# Patient Record
Sex: Female | Born: 1935 | Race: White | Hispanic: No | Marital: Single
Health system: Southern US, Community
[De-identification: ages and names within clinical notes are randomized; demographics above are authoritative.]

---

## 2004-11-18 ENCOUNTER — Ambulatory Visit: Payer: Self-pay | Admitting: Oncology

## 2004-11-28 ENCOUNTER — Ambulatory Visit: Payer: Self-pay | Admitting: Oncology

## 2004-12-11 ENCOUNTER — Ambulatory Visit: Payer: Self-pay | Admitting: Family Medicine

## 2005-05-19 ENCOUNTER — Ambulatory Visit: Payer: Self-pay | Admitting: Oncology

## 2005-05-29 ENCOUNTER — Ambulatory Visit: Payer: Self-pay | Admitting: Oncology

## 2005-11-17 ENCOUNTER — Ambulatory Visit: Payer: Self-pay | Admitting: Oncology

## 2005-11-28 ENCOUNTER — Ambulatory Visit: Payer: Self-pay | Admitting: Oncology

## 2006-01-08 ENCOUNTER — Ambulatory Visit: Payer: Self-pay | Admitting: Family Medicine

## 2006-11-17 ENCOUNTER — Ambulatory Visit: Payer: Self-pay | Admitting: Oncology

## 2006-11-28 ENCOUNTER — Ambulatory Visit: Payer: Self-pay | Admitting: Oncology

## 2007-02-18 ENCOUNTER — Ambulatory Visit: Payer: Self-pay | Admitting: Family Medicine

## 2007-02-23 ENCOUNTER — Ambulatory Visit: Payer: Self-pay | Admitting: Family Medicine

## 2007-04-23 ENCOUNTER — Ambulatory Visit: Payer: Self-pay | Admitting: Unknown Physician Specialty

## 2007-10-30 ENCOUNTER — Ambulatory Visit: Payer: Self-pay | Admitting: Internal Medicine

## 2007-11-17 ENCOUNTER — Ambulatory Visit: Payer: Self-pay | Admitting: Oncology

## 2007-11-29 ENCOUNTER — Ambulatory Visit: Payer: Self-pay | Admitting: Oncology

## 2007-11-29 ENCOUNTER — Ambulatory Visit: Payer: Self-pay | Admitting: Internal Medicine

## 2008-03-21 ENCOUNTER — Ambulatory Visit: Payer: Self-pay | Admitting: Family Medicine

## 2008-03-23 ENCOUNTER — Ambulatory Visit: Payer: Self-pay | Admitting: Family Medicine

## 2008-05-17 ENCOUNTER — Ambulatory Visit: Payer: Self-pay | Admitting: Ophthalmology

## 2008-05-17 ENCOUNTER — Other Ambulatory Visit: Payer: Self-pay

## 2008-05-30 ENCOUNTER — Ambulatory Visit: Payer: Self-pay | Admitting: Ophthalmology

## 2008-10-29 ENCOUNTER — Ambulatory Visit: Payer: Self-pay | Admitting: Internal Medicine

## 2008-11-27 ENCOUNTER — Ambulatory Visit: Payer: Self-pay | Admitting: Internal Medicine

## 2008-11-28 ENCOUNTER — Ambulatory Visit: Payer: Self-pay | Admitting: Internal Medicine

## 2009-04-05 ENCOUNTER — Ambulatory Visit: Payer: Self-pay | Admitting: Family Medicine

## 2009-10-29 ENCOUNTER — Ambulatory Visit: Payer: Self-pay | Admitting: Internal Medicine

## 2009-11-26 ENCOUNTER — Ambulatory Visit: Payer: Self-pay | Admitting: Internal Medicine

## 2009-11-28 ENCOUNTER — Ambulatory Visit: Payer: Self-pay | Admitting: Internal Medicine

## 2010-12-09 ENCOUNTER — Ambulatory Visit: Payer: Self-pay | Admitting: Internal Medicine

## 2010-12-29 ENCOUNTER — Ambulatory Visit: Payer: Self-pay | Admitting: Internal Medicine

## 2012-01-12 ENCOUNTER — Ambulatory Visit: Payer: Self-pay | Admitting: Internal Medicine

## 2012-01-12 LAB — CBC CANCER CENTER
Basophil #: 0.1 x10 3/mm (ref 0.0–0.1)
Basophil %: 1 %
Eosinophil #: 0.3 x10 3/mm (ref 0.0–0.7)
Eosinophil %: 5.5 %
HCT: 35.7 % (ref 35.0–47.0)
HGB: 11.9 g/dL — ABNORMAL LOW (ref 12.0–16.0)
Lymphocyte #: 1.3 x10 3/mm (ref 1.0–3.6)
Lymphocyte %: 24.7 %
MCH: 32.8 pg (ref 26.0–34.0)
MCHC: 33.3 g/dL (ref 32.0–36.0)
MCV: 98.6 fL (ref 80–100)
Monocyte #: 0.3 x10 3/mm (ref 0.0–0.7)
Monocyte %: 6.1 %
Neutrophil #: 3.3 x10 3/mm (ref 1.4–6.5)
Neutrophil %: 62.7 %
Platelet: 188 x10 3/mm (ref 150–440)
RBC: 3.62 10*6/uL — ABNORMAL LOW (ref 3.80–5.20)
RDW: 13.1 % (ref 11.5–14.5)
WBC: 5.3 x10 3/mm (ref 3.6–11.0)

## 2012-01-12 LAB — COMPREHENSIVE METABOLIC PANEL
Albumin: 4 g/dL (ref 3.4–5.0)
Alkaline Phosphatase: 83 U/L (ref 50–136)
Anion Gap: 6 — ABNORMAL LOW (ref 7–16)
BUN: 28 mg/dL — ABNORMAL HIGH (ref 7–18)
Bilirubin,Total: 0.6 mg/dL (ref 0.2–1.0)
Calcium, Total: 9.4 mg/dL (ref 8.5–10.1)
Chloride: 103 mmol/L (ref 98–107)
Co2: 31 mmol/L (ref 21–32)
Creatinine: 1.53 mg/dL — ABNORMAL HIGH (ref 0.60–1.30)
EGFR (African American): 41 — ABNORMAL LOW
EGFR (Non-African Amer.): 34 — ABNORMAL LOW
Glucose: 95 mg/dL (ref 65–99)
Osmolality: 285 (ref 275–301)
Potassium: 3.9 mmol/L (ref 3.5–5.1)
SGOT(AST): 26 U/L (ref 15–37)
SGPT (ALT): 21 U/L
Sodium: 140 mmol/L (ref 136–145)
Total Protein: 7.4 g/dL (ref 6.4–8.2)

## 2012-01-21 ENCOUNTER — Inpatient Hospital Stay: Payer: Self-pay | Admitting: Unknown Physician Specialty

## 2012-01-21 LAB — CBC
HCT: 33.5 % — ABNORMAL LOW (ref 35.0–47.0)
MCH: 32.6 pg (ref 26.0–34.0)
MCHC: 33.1 g/dL (ref 32.0–36.0)
MCV: 98 fL (ref 80–100)
RBC: 3.41 10*6/uL — ABNORMAL LOW (ref 3.80–5.20)
RDW: 13.9 % (ref 11.5–14.5)
WBC: 6.1 10*3/uL (ref 3.6–11.0)

## 2012-01-21 LAB — BASIC METABOLIC PANEL
Calcium, Total: 9.2 mg/dL (ref 8.5–10.1)
Chloride: 103 mmol/L (ref 98–107)
Co2: 29 mmol/L (ref 21–32)
Creatinine: 1.38 mg/dL — ABNORMAL HIGH (ref 0.60–1.30)
EGFR (African American): 47 — ABNORMAL LOW
Osmolality: 294 (ref 275–301)
Potassium: 4 mmol/L (ref 3.5–5.1)
Sodium: 143 mmol/L (ref 136–145)

## 2012-01-22 LAB — HEMOGLOBIN: HGB: 9.2 g/dL — ABNORMAL LOW (ref 12.0–16.0)

## 2012-01-23 LAB — BASIC METABOLIC PANEL
BUN: 24 mg/dL — ABNORMAL HIGH (ref 7–18)
Calcium, Total: 8 mg/dL — ABNORMAL LOW (ref 8.5–10.1)
Chloride: 105 mmol/L (ref 98–107)
Osmolality: 290 (ref 275–301)
Potassium: 3.7 mmol/L (ref 3.5–5.1)
Sodium: 143 mmol/L (ref 136–145)

## 2012-01-23 LAB — HEMOGLOBIN
HGB: 8.3 g/dL — ABNORMAL LOW (ref 12.0–16.0)
HGB: 7.9 g/dL — ABNORMAL LOW (ref 12.0–16.0)

## 2012-01-24 LAB — BASIC METABOLIC PANEL
Anion Gap: 11 (ref 7–16)
BUN: 20 mg/dL — ABNORMAL HIGH (ref 7–18)
EGFR (Non-African Amer.): 44 — ABNORMAL LOW
Glucose: 103 mg/dL — ABNORMAL HIGH (ref 65–99)
Osmolality: 286 (ref 275–301)
Potassium: 3.4 mmol/L — ABNORMAL LOW (ref 3.5–5.1)

## 2012-01-24 LAB — HEMOGLOBIN: HGB: 9.8 g/dL — ABNORMAL LOW (ref 12.0–16.0)

## 2012-01-27 ENCOUNTER — Encounter: Payer: Self-pay | Admitting: Internal Medicine

## 2012-01-30 ENCOUNTER — Ambulatory Visit: Payer: Self-pay | Admitting: Internal Medicine

## 2012-01-30 ENCOUNTER — Encounter: Payer: Self-pay | Admitting: Internal Medicine

## 2012-02-27 ENCOUNTER — Encounter: Payer: Self-pay | Admitting: Internal Medicine

## 2012-03-30 ENCOUNTER — Inpatient Hospital Stay: Payer: Self-pay | Admitting: Specialist

## 2012-03-30 LAB — URINALYSIS, COMPLETE
Bilirubin,UR: NEGATIVE
Glucose,UR: NEGATIVE mg/dL (ref 0–75)
Ketone: NEGATIVE
Nitrite: NEGATIVE

## 2012-03-30 LAB — COMPREHENSIVE METABOLIC PANEL
Albumin: 2.6 g/dL — ABNORMAL LOW (ref 3.4–5.0)
Alkaline Phosphatase: 112 U/L (ref 50–136)
BUN: 27 mg/dL — ABNORMAL HIGH (ref 7–18)
Chloride: 103 mmol/L (ref 98–107)
Creatinine: 1.6 mg/dL — ABNORMAL HIGH (ref 0.60–1.30)
Glucose: 131 mg/dL — ABNORMAL HIGH (ref 65–99)
SGPT (ALT): 14 U/L
Total Protein: 7.3 g/dL (ref 6.4–8.2)

## 2012-03-30 LAB — CBC
HCT: 30.7 % — ABNORMAL LOW (ref 35.0–47.0)
RDW: 15.6 % — ABNORMAL HIGH (ref 11.5–14.5)
WBC: 8.1 10*3/uL (ref 3.6–11.0)

## 2012-03-30 LAB — TROPONIN I: Troponin-I: 0.02 ng/mL

## 2012-03-31 LAB — CBC WITH DIFFERENTIAL/PLATELET
Basophil #: 0.1 10*3/uL (ref 0.0–0.1)
Eosinophil #: 0.1 10*3/uL (ref 0.0–0.7)
Eosinophil %: 1 %
HCT: 27.7 % — ABNORMAL LOW (ref 35.0–47.0)
HGB: 9 g/dL — ABNORMAL LOW (ref 12.0–16.0)
Lymphocyte #: 0.7 10*3/uL — ABNORMAL LOW (ref 1.0–3.6)
Lymphocyte %: 11.2 %
MCHC: 32.6 g/dL (ref 32.0–36.0)
MCV: 93 fL (ref 80–100)
Neutrophil #: 5.3 10*3/uL (ref 1.4–6.5)
RBC: 2.97 10*6/uL — ABNORMAL LOW (ref 3.80–5.20)
RDW: 15.9 % — ABNORMAL HIGH (ref 11.5–14.5)

## 2012-03-31 LAB — BASIC METABOLIC PANEL
Anion Gap: 13 (ref 7–16)
BUN: 22 mg/dL — ABNORMAL HIGH (ref 7–18)
Calcium, Total: 8.3 mg/dL — ABNORMAL LOW (ref 8.5–10.1)
Chloride: 108 mmol/L — ABNORMAL HIGH (ref 98–107)
Co2: 21 mmol/L (ref 21–32)
Creatinine: 1.28 mg/dL (ref 0.60–1.30)
EGFR (African American): 51 — ABNORMAL LOW
Osmolality: 285 (ref 275–301)
Potassium: 4.1 mmol/L (ref 3.5–5.1)
Sodium: 142 mmol/L (ref 136–145)

## 2012-04-02 ENCOUNTER — Encounter: Payer: Self-pay | Admitting: Internal Medicine

## 2012-04-09 ENCOUNTER — Ambulatory Visit: Payer: Self-pay | Admitting: Internal Medicine

## 2012-04-09 LAB — URINALYSIS, COMPLETE
Bacteria: NEGATIVE
Bilirubin,UR: NEGATIVE
Blood: NEGATIVE
Ketone: NEGATIVE
RBC,UR: NONE SEEN /HPF (ref 0–5)

## 2012-04-11 LAB — URINE CULTURE

## 2012-04-28 ENCOUNTER — Encounter: Payer: Self-pay | Admitting: Internal Medicine

## 2013-06-10 ENCOUNTER — Ambulatory Visit: Payer: Self-pay | Admitting: Emergency Medicine

## 2013-06-10 LAB — BASIC METABOLIC PANEL
Anion Gap: 10 (ref 7–16)
Calcium, Total: 9.5 mg/dL (ref 8.5–10.1)
Chloride: 103 mmol/L (ref 98–107)
Co2: 26 mmol/L (ref 21–32)
Creatinine: 1.52 mg/dL — ABNORMAL HIGH (ref 0.60–1.30)
EGFR (African American): 35 — ABNORMAL LOW
EGFR (Non-African Amer.): 30 — ABNORMAL LOW
Osmolality: 284 (ref 275–301)
Sodium: 139 mmol/L (ref 136–145)

## 2013-06-10 LAB — CBC
HCT: 30.7 % — ABNORMAL LOW (ref 35.0–47.0)
MCHC: 33.4 g/dL (ref 32.0–36.0)
MCV: 95 fL (ref 80–100)
Platelet: 159 10*3/uL (ref 150–440)
RBC: 3.23 10*6/uL — ABNORMAL LOW (ref 3.80–5.20)

## 2013-06-12 ENCOUNTER — Inpatient Hospital Stay: Payer: Self-pay | Admitting: Internal Medicine

## 2013-06-12 LAB — CBC WITH DIFFERENTIAL/PLATELET
Basophil #: 0 10*3/uL (ref 0.0–0.1)
Eosinophil #: 0.1 10*3/uL (ref 0.0–0.7)
HCT: 26.9 % — ABNORMAL LOW (ref 35.0–47.0)
HGB: 9.3 g/dL — ABNORMAL LOW (ref 12.0–16.0)
Lymphocyte #: 0.8 10*3/uL — ABNORMAL LOW (ref 1.0–3.6)
Lymphocyte %: 15.1 %
MCHC: 34.5 g/dL (ref 32.0–36.0)
MCV: 93 fL (ref 80–100)
Monocyte #: 0.3 x10 3/mm (ref 0.2–0.9)
Monocyte %: 6.6 %
Neutrophil %: 74.9 %
Platelet: 143 10*3/uL — ABNORMAL LOW (ref 150–440)
RBC: 2.88 10*6/uL — ABNORMAL LOW (ref 3.80–5.20)
RDW: 12.7 % (ref 11.5–14.5)
WBC: 5.1 10*3/uL (ref 3.6–11.0)

## 2013-06-12 LAB — BASIC METABOLIC PANEL
Anion Gap: 7 (ref 7–16)
Chloride: 105 mmol/L (ref 98–107)
EGFR (African American): 42 — ABNORMAL LOW
Glucose: 82 mg/dL (ref 65–99)
Osmolality: 282 (ref 275–301)
Sodium: 139 mmol/L (ref 136–145)

## 2013-06-12 LAB — MAGNESIUM: Magnesium: 1.4 mg/dL — ABNORMAL LOW

## 2013-06-13 LAB — BASIC METABOLIC PANEL
Anion Gap: 7 (ref 7–16)
BUN: 38 mg/dL — ABNORMAL HIGH (ref 7–18)
Calcium, Total: 8.7 mg/dL (ref 8.5–10.1)
Chloride: 100 mmol/L (ref 98–107)
Creatinine: 1.43 mg/dL — ABNORMAL HIGH (ref 0.60–1.30)
Potassium: 3.9 mmol/L (ref 3.5–5.1)
Sodium: 135 mmol/L — ABNORMAL LOW (ref 136–145)

## 2013-06-13 LAB — MAGNESIUM: Magnesium: 2.2 mg/dL

## 2013-06-14 LAB — STOOL CULTURE

## 2013-07-07 IMAGING — CT CT HEAD WITHOUT CONTRAST
2 series · 15 of 30 positions shown, 19 images · non-contrast
Comparison: none

REASON FOR EXAM: CVA
COMMENTS:   May transport without cardiac monitor

PROCEDURE:     CT  - CT HEAD WITHOUT CONTRAST  - March 30, 2012  [DATE]
RESULT:     Head CT dated 03/30/2012.
TECHNIQUE: Helical noncontrasted 5 mm sections were obtained from the skull
base to the vertex.

[Series 2: without · axial · non-contrast · 0.41mm/px · z∈[+322,+466]mm · 13 of 35 slices shown, 17 images]
[im 3/35  brain]
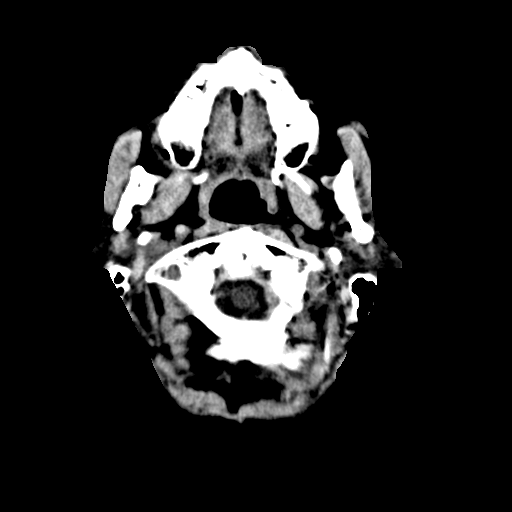
[im 3/35  bone]
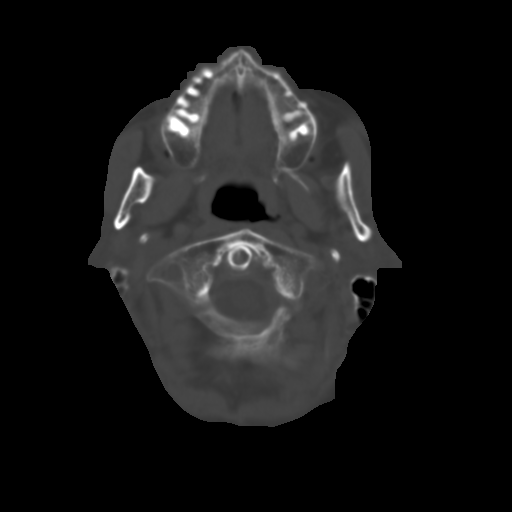
[im 5/35  brain]
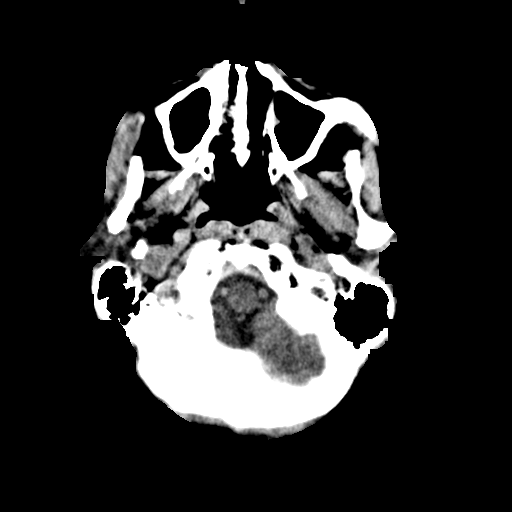
[im 8/35  brain]
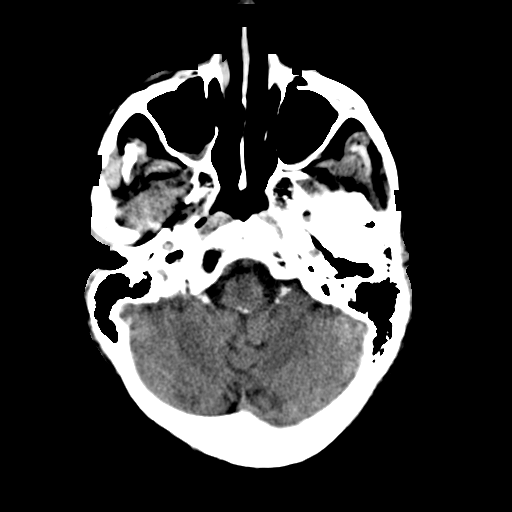
[im 10/35  brain]
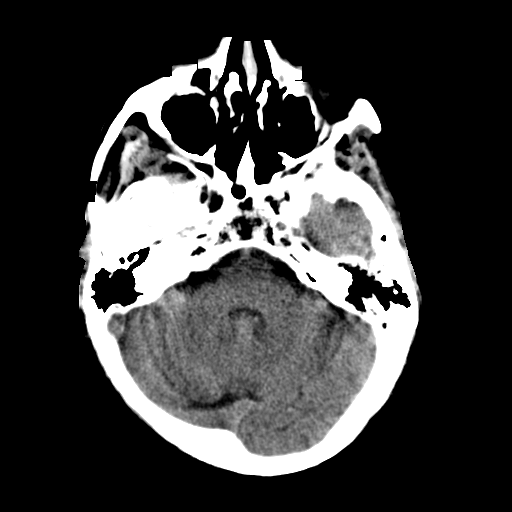
[im 13/35  brain]
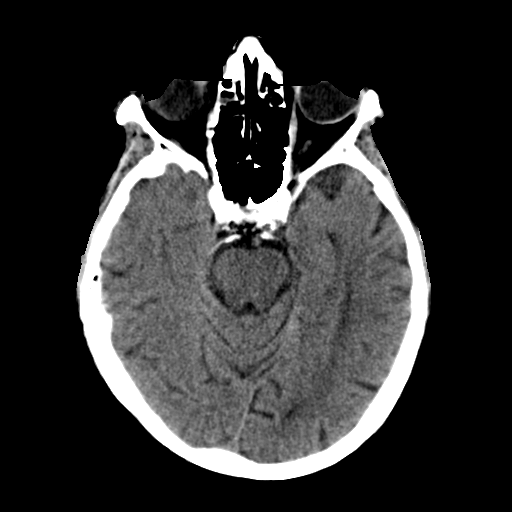
[im 13/35  bone]
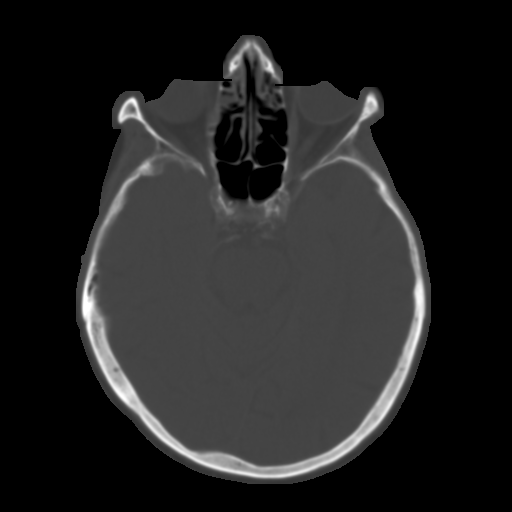
[im 15/35  brain]
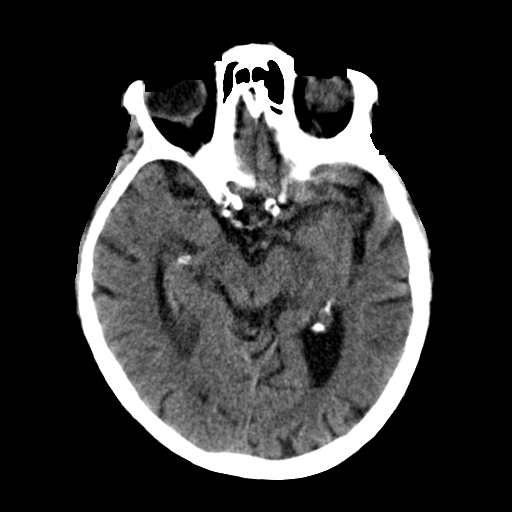
[im 18/35  brain]
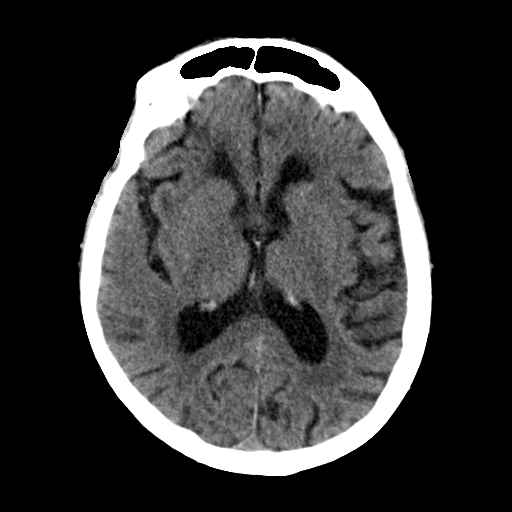
[im 20/35  brain]
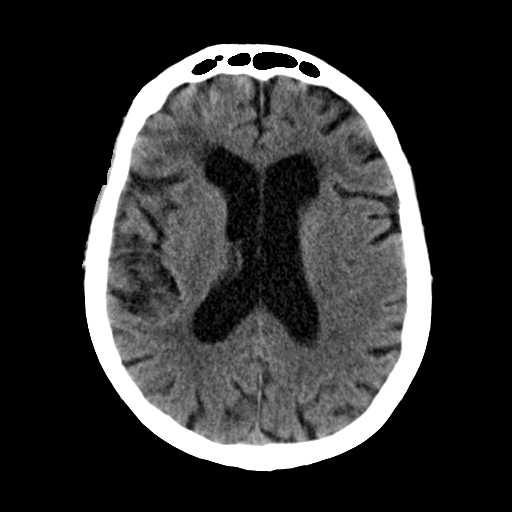
[im 22/35  brain]
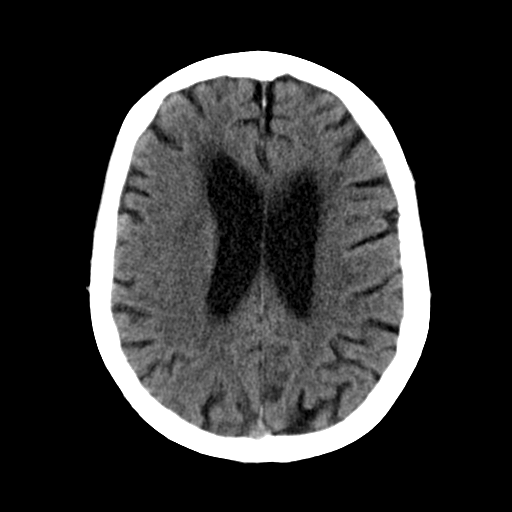
[im 22/35  bone]
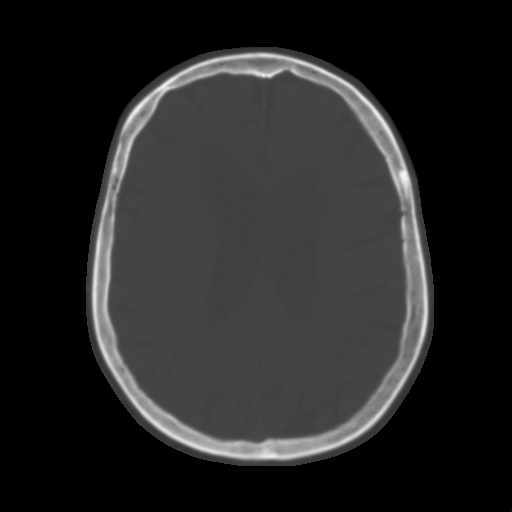
[im 25/35  brain]
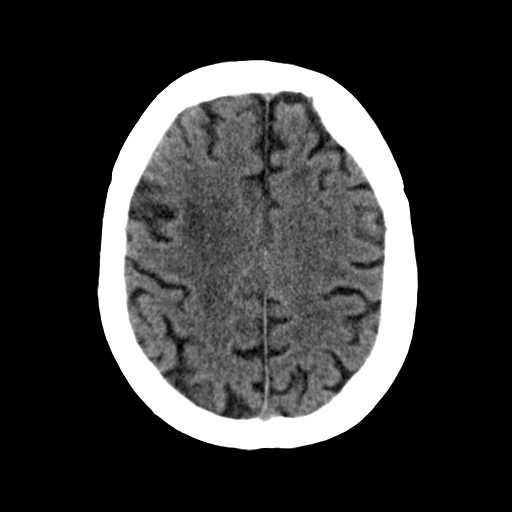
[im 27/35  brain]
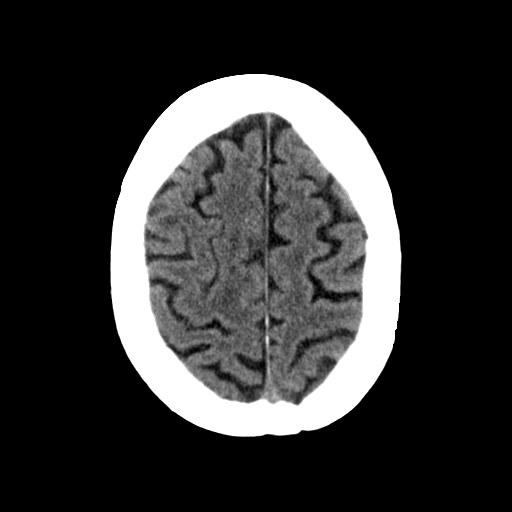
[im 30/35  brain]
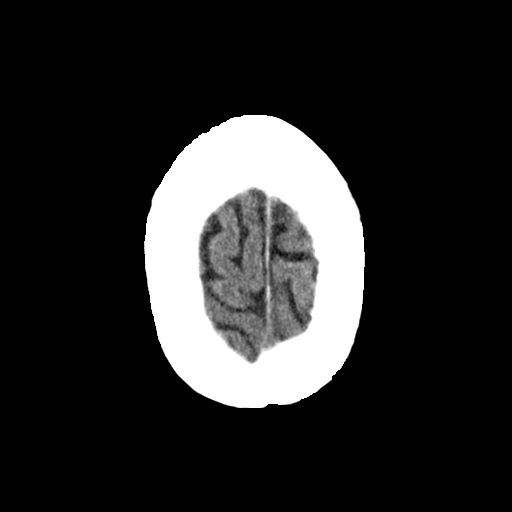
[im 32/35  brain]
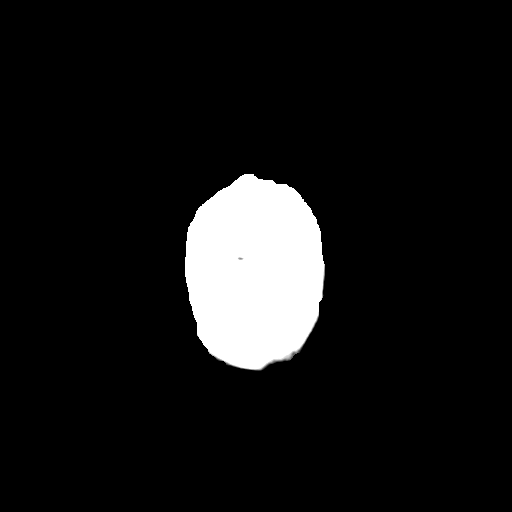
[im 32/35  bone]
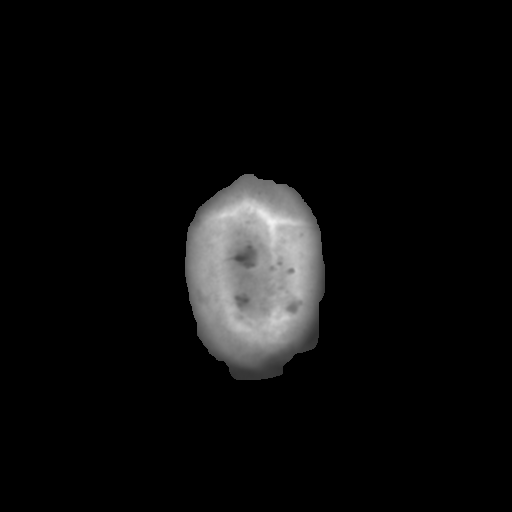

[Series 3: bone · axial · 0.41mm/px · z∈[+322,+346]mm · 2 of 35 slices shown]
[im 3/35  bone]
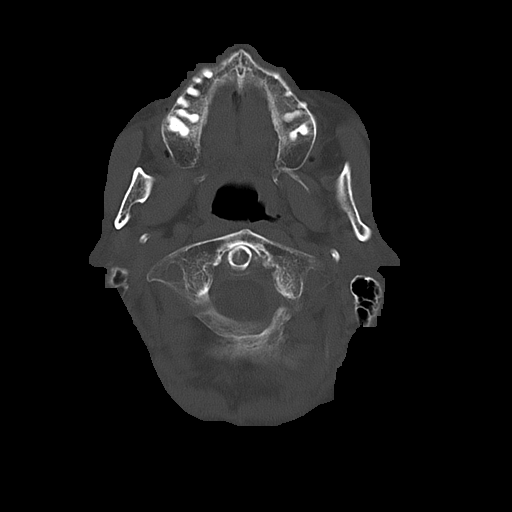
[im 8/35  bone]
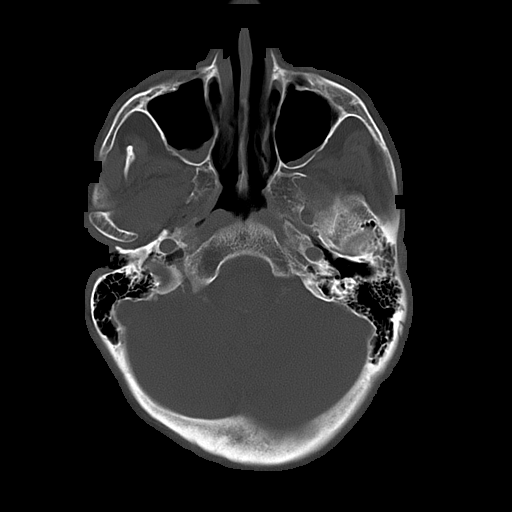

[15 of 30 positions shown; findings below may reference images not displayed]

FINDINGS: There is no evidence of intra-axial nor extra-axial fluid
collections nor evidence of acute hemorrhage. There is no evidence of mass
effect. Mild to moderate diffuse cortical atrophy is appreciated. Diffuse
areas of low attenuation project within the subcortical, deep, and
periventricular white matter regions. Hydrocephalus ex vacuo is appreciated.
There is no evidence of a depressed skull fracture. A mild air-fluid levels
identified within the right maxillary sinus. Remaining visualized paranasal
sinuses and mastoid air cells are patent.
IMPRESSION: Findings which may reps an sequela sinus disease involving
the right maxillary sinus.
2. Moderate involutional changes
3. No evidence of acute abnormalities.

## 2013-12-29 DEATH — deceased

## 2015-04-20 NOTE — Discharge Summary (Signed)
PATIENT NAMKarene [name]:  [name], [name] MR#:  [id number] DATE OF BIRTH:  [birth year]  DATE OF ADMISSION:  06/12/2013 DATE OF DISCHARGE:  06/15/2013   PRIMARY CARE PHYSICIAN: [name] MaplesGlenn R. Beckey DowningWillett, MD  DISCHARGE DIAGNOSES:  1. Acute T4 compression deformity with retropulsed fragment.  2. Acute renal failure plus chronic kidney disease.  3. Hypomagnesemia.  4. Hypertension.  5. Chronic atrial fibrillation.  6. Hypothyroidism.   CONDITION: Stable.   CODE STATUS: Full code.   HOME MEDICATIONS:  1. Amlodipine 5 mg p.o. daily. 2. Synthroid 112 mcg p.o. daily. 3. Pantoprazole 40 mg p.o. daily.   DIET: Low sodium diet.   ACTIVITY: As tolerated.   FOLLOWUP CARE: Follow up with PCP within 1 to 2 weeks, and also, the patient needs continued TLSO, [facility], Dr. Venetia MaxonStern within 1 week.   REASON FOR ADMISSION: Fall.   HOSPITAL COURSE: The patient is an 81 year old pleasant Caucasian female with multiple medical problems. The patient lost her balance after slamming her car door and landed on her side. She was sent to ED. Pelvic and chest x-ray revealed a fracture of the superior and inferior pubic rami on the right side and compression fracture of the lumbar spine. ED physician discussed with orthopedic doctor, who recommended pain management. The patient's CAT scan of the lumbar spine showed acute compression deformity of T2 with 6.5 mm retropulsed fragment, and ED physician discussed with the [facility], Dr. Venetia MaxonStern, who recommended outpatient followup with him after the patient gets discharged. In addition, he recommended a TLSO brace. For detailed history and physical examination, please refer to the admission note dictated by Dr. Amado CoeGouru. After admission, the patient got physical therapy. The patient had a low magnesium which was treated with magnesium. Hypomagnesemia has improved. The patient also had mild dehydration and acute renal failure and was treated with  IV fluid support. Kidney function has been stable. The patient has no symptoms, but according to physical therapy evaluation, the patient needs subacute rehab placement. The patient is clinically stable and will be discharged to a subacute rehab today. I discussed the patient's discharge plan with the patient and the case manager.   TIME SPENT: About 36 minutes.   ____________________________ [name] PollackQing Velma Agnes, MD qc:OSi D: 06/15/2013 11:13:57 ET T: 06/15/2013 11:33:09 ET JOB#: [id number]  cc: [name] PollackQing Lilybeth Vien, MD, <Dictator> [name] PollackQING Leida Luton MD ELECTRONICALLY SIGNED 06/15/2013 16:22

## 2015-04-20 NOTE — H&P (Signed)
[name], [name] MR#:  [id number] DATE OF BIRTH:  1922/11/06  DATE OF ADMISSION:  06/11/2013  PRIMARY CARE PHYSICIAN:  Barry Brunner, M.D.   REFERRING PHYSICIAN:  Dr. Glenetta Hew.  CHIEF COMPLAINT:  Fall.   HISTORY OF PRESENT ILLNESS:  [name] is an 81 year old pleasant Caucasian female with multiple medical problems who sustained a fall at her home.  [name] lost balance after slamming her car door and landed on her side.  She managed to walk around some at home with a walker before coming to the ER as the pain is getting worse.  A pelvic x-ray has revealed a fracture from the superior and inferior pubic rami on the right and compression fractures on the lumbar spine.  The ER physician has discussed with orthopedics doctor on-call who has recommended only pain management.  Eventually CAT scan of the lumbar spine has revealed acute compression deformity of T12 with a 6.5 mm retropulsed fragment.  Dr. Glenetta Hew has discussed this with neurosurgeon at [facility], Dr. Venetia Maxon, who has recommended outpatient follow-up with him after [name] gets discharged.  Also, he has recommended to put [name] on TLSO brace.  Hospitalist team is called to admit [name] for pain management.  During my examination [name]'s pain is well-controlled and denies any symptoms.  [name] denies any dizziness, chest pain, shortness of breath, headache or blurry vision.  No other complaints.  No family members at bedside during my examination.   PAST MEDICAL HISTORY:  History of mild to moderate aortic stenosis, moderate mitral regurgitation, history of chronic atrial fibrillation, pulmonary hypertension, hypertension, hyperlipidemia, left ventricular ejection fraction of 55%, hypothyroidism, osteoporosis, chronic kidney disease stage 3 with a baseline creatinine of 1.4, diverticulosis, iron deficiency anemia, colon cancer diagnosed in May 1999 status post resection as well as chemo.   PAST  SURGICAL HISTORY:  History of colon cancer status post resection of the colon, urethral cyst reduction, thyroidectomy, right hip intertrochanteric fracture repair with nail.  ALLERGIES:  [name] has no known drug allergies.   HOME MEDICATIONS:  Synthroid 112 mcg once daily, pantoprazole 40 mg once daily, hydrochlorothiazide 25 mg once a day, amlodipine 5 mg once a day.   PSYCHOSOCIAL HISTORY:  Lives at home with her 2 other sisters.  Denies any tobacco, alcohol or illicit drug usage.   FAMILY HISTORY:  The father had MI and died of Parkinson's disease at age 57.  Mother died of a CVA at age 34.    REVIEW OF SYSTEMS:  CONSTITUTIONAL:  Denies any fever, fatigue, weakness.  EYES:  Denies any blurry vision, glaucoma, cataracts.  EARS, NOSE, THROAT:  No epistaxis, discharge, snoring.  RESPIRATION:  Denies any cough, COPD, hemoptysis, dyspnea.  CARDIOVASCULAR:  No chest pain, palpitations, syncope.  GASTROINTESTINAL:  Denies any nausea, vomiting, diarrhea, GERD.  GENITOURINARY:  No dysuria or hematuria.  GYNECOLOGIC AND BREASTS:  Denies any breast mass or vaginal discharge.  ENDOCRINE:  Denies any polyuria, nocturia or thyroid problems.  HEMATOLOGIC AND LYMPHATIC:  Denies any anemia, easy bruising or bleeding.  MUSCULOSKELETAL:  Minimal pain in the back and in the hip area.  Has osteoporosis.  Denies any gout.  NEUROLOGIC:  Denies any CVA or TIA, ataxia, vertigo. PSYCHIATRIC:  Denies any insomnia, ADD, OCD.   PHYSICAL EXAMINATION: VITAL SIGNS:  Temperature 98.3, pulse 78, respirations 18, blood pressure 153/74, pulse ox 97% on 2 liters.  GENERAL APPEARANCE:  Not under acute distress.  Moderately built and  thin-appearing  [name].  HEENT:  Normocephalic, atraumatic.  Pupils are equally reacting to light and accommodation.  No scleral icterus.  No conjunctival injection.  No sinus tenderness.  No postnasal drip.  No pharyngeal exudate.  NECK:  Supple.  No JVD.  No thyromegaly.  No  lymphadenopathy.  LUNGS:  Clear to auscultation bilaterally.  No accessory muscle usage.  No anterior chest wall tenderness on palpation.  CARDIAC:  S1, S2 normal.  Positive ejection systolic murmur.  No edema.  GASTROINTESTINAL:  Soft.  Bowel sounds are positive in all four quadrants.  Nontender, nondistended.  No masses felt.  No hepatosplenomegaly.  NEUROLOGIC:  Awake, alert, oriented x 3.  Cranial nerves II through XII are intact.  Sensory is grossly intact.  Reflexes are 2+.  EXTREMITIES:  Complaining of pain in the right hip and also in the lower back, but no vertebral tenderness.  No edema.  No cyanosis.  No clubbing.  MUSCULOSKELETAL:  No joint effusion, tenderness or erythema.  SKIN:  Warm to touch.  Normal turgor.  No rashes.  No lesions noticed.  PSYCHIATRIC:  Normal mood and affect.   LABORATORY AND IMAGING STUDIES:  Right hip x-ray has revealed displaced fractures of the right superior and inferior pubic rami.  Pelvic x-ray, fractures of the superior and inferior pubic rami on the right.  Lumbar spine x-ray has revealed compression fractures.  No retropulsion of fragments are noticed.  CAT scan of the lumbar spine has revealed acute compression deformity of T12 with a 6.5 mm retropulsed fragment, chronic-appearing superior end plate compression deformity of L3, anterior spondylolisthesis of L4 on 5.  There also appears to be a central canal stenosis at this level due to ligamentum flavum hypertrophy and a broad-based disk bulge.  Glucose 104, BUN 31, creatinine 1.52, sodium was 139, potassium 4.5, chloride 102, CO2 26, anion gap is 10.  GFR 30.  Serum osmolality 284, calcium 9.5.  WBC 7.6, hemoglobin is 10.2, hematocrit 30.7, platelets 159.    ASSESSMENT AND PLAN:  An 81 yr old pleasant Caucasian female brought into the ER after she sustained a fall and having difficulty with ambulation, will be admitted with the following assessment and plan.  1.  Acute T12 compression deformity with a  retropulsed fragment of 6.5 mm in size.  This was discussed with ER physician with on-call [facility] at [facility], Dr. Venetia MaxonStern, who has recommended TLSO brace and outpatient follow-up with him after discharge.  His contact phone number is [phone].  We will provide [name] pain management with Percocet as needed.  2.  Hypertension.  Blood pressure is stable.  Resume her home medications.  3.  Chronic atrial fibrillation, rate-controlled, not on Coumadin.  4.  Hypothyroidism.  Continue Synthroid.   5.  We will provide her gastrointestinal and deep vein thrombosis prophylaxis.  6.  CODE STATUS:  She is FULL CODE for now until she discusses her CODE STATUS with her two sisters.   The diagnosis and management of care was discussed in detail with [name].  She is aware of the plan.    Total time spent on admission is 50 minutes.    ____________________________ Ramonita LabAruna Faige Seely, MD ag:ea D: 06/11/2013 01:22:03 ET T: 06/11/2013 02:10:56 ET JOB#: [id number]  cc: Ramonita LabAruna Leliana Kontz, MD, <Dictator> Jorje GuildGlenn R. Beckey DowningWillett, MD Ramonita LabARUNA Muadh Creasy MD ELECTRONICALLY SIGNED 06/13/2013 22:37

## 2015-04-22 NOTE — H&P (Signed)
PATIENT NAMESHACARRA, [name] MR#:  [id number] DATE OF BIRTH:  1922/11/28  DATE OF ADMISSION:  01/21/2012  CHIEF COMPLAINT: Right hip and right shoulder pain.   HISTORY OF PRESENT ILLNESS: Patient was getting out of a car, staggered and fell landing on her right side. Was brought to the Emergency Room where x-ray showed an intertrochanteric fracture right hip and proximal humerus fracture, right shoulder without displacement of significance. She denies any other injuries. She denies any significant issues with these joints before.   She has been fairly comfortable in the Emergency Room.   PAST MEDICAL HISTORY: 1. Chronic atrial fibrillation without being on Coumadin. 2. Moderate aortic stenosis with biatrial enlargement. 3. Severe mitral and tricuspid insufficiency with moderate pulmonary hypertension.  4. Hypertension.  5. Hyperlipidemia.   MEDICAL PHYSICIAN: [facility] with Mariah Milling, FNP.   SOCIAL HISTORY: Patient lives at home.   DRUG ALLERGIES: None.   MEDICATIONS:  1. Lisinopril 20 mg daily.  2. Hydrochlorothiazide 25 mg daily.  3. Levothyroxine 0.112 mg daily.  4. Norvasc 5 mg a day.   FAMILY HISTORY: Notable for CVA.   REVIEW OF SYSTEMS: Unremarkable for any acute cardiac, respiratory, GI, GU symptoms. No fevers, chills, or constitutional symptoms.   PHYSICAL EXAMINATION:  GENERAL: Elderly female. She is fairly comfortable lying on the bed. She is alert and oriented.   VITALS: Blood pressure 130/80, pulse 80 and regular, respirations 18.   HEENT: Pupils equal, round, and reactive to light and accommodation. Extraocular movements intact.   NECK: Supple without bruits.   LUNGS: Clear.   CARDIAC: Reveals a murmur.   ABDOMEN: Benign.   RIGHT UPPER EXTREMITY: Swelling about the proximal humerus with tenderness. Passive range of motion causes pain. She has very limited active range of motion because of pain. Elbow, wrist, and hand have overall good  range of motion with some degenerative changes about the wrist and hand. Neurovascular examination of the right upper extremity is intact.   LEFT SHOULDER, ELBOW, WRIST, AND HAND: Overall good range of motion with no significant tenderness.   CERVICAL SPINE: Minimal muscle guarding with no significant tenderness.   THORACIC SPINE: Slight kyphosis with no significant tenderness.   LUMBOSACRAL SPINE: Minimal tenderness.   LEFT LOWER EXTREMITY: Hip, knee, foot and ankle overall have good range of motion. Neurovascular examination of lower extremity is intact grossly.   RIGHT LOWER EXTREMITY: Held in a short and externally rotated position. There is pain about the groin with any attempted range of motion. Knee shows mild diffuse tenderness with no instability or swelling. No severe point tenderness. Foot and ankle unremarkable. Neurovascular examination of the right lower extremity is grossly intact.   NOTE: [facility] records are reviewed.   LABORATORY, DIAGNOSTIC AND RADIOLOGICAL DATA: X-rays are reviewed. The right hip shows an intertrochanteric fracture with displacement.   Right shoulder shows a proximal humerus fracture with no significant displacement. CXR showed no acute disease. EKG shows no acute disease.   CLINICAL IMPRESSION:  1. Right intertrochanteric hip fracture with displacement, will need open reduction internal fixation.  2. Right proximal humerus fracture without displacement, may ultimately displace but will keep an eye on this. Will treat conservatively.  3. Significant medical history with aortic stenosis, atrial fibrillation, mitral and tricuspid regurgitation.  4. Medical history of osteoporosis, hypothyroidism, levothyroxine.   PLAN: Admit. To be seen by [facility]. Discussed with her POA. Plan open reduction internal fixation of the right hip. Most likely will need a general because  of aortic stenosis. She understands possibility of loss of reduction of the  position of the shoulder.   ____________________________ Winn JockJames C. Gerrit Heckaliff, MD jcc:cms D: 01/21/2012 23:33:31 ET T: 01/22/2012 10:37:32 ET JOB#: [id number]  cc: Winn JockJames C. Gerrit Heckaliff, MD, <Dictator>  Winn JockJAMES C Avenell Sellers MD ELECTRONICALLY SIGNED 01/23/2012 12:30

## 2015-04-22 NOTE — H&P (Signed)
PATIENT NAMKarene [name]:  [name], [name] MR#:  [id number] DATE OF BIRTH:  March 13, 1922  DATE OF ADMISSION:  03/30/2012  ADDENDUM:   The patient's medication list is updated:  1. Amlodipine 5 mg p.o. daily.  2. Hydrochlorothiazide 25 mg p.o. daily.  3. Lisinopril 20 mg p.o. daily.  4. Pantoprazole 40 mg p.o. daily.  5. Synthroid 112 mcg p.o. daily.  6. It is unclear if she was on aspirin recently.   ____________________________ [name], MD rv:drc D: 03/30/2012 17:57:13 ET T: 03/31/2012 06:00:44 ET JOB#: [id number]  cc: [name], MD, <Dictator> [name] MD ELECTRONICALLY SIGNED 03/31/2012 14:26

## 2015-04-22 NOTE — Op Note (Signed)
PATIENT NAME:  [name], [name] MR#:  [id number] DATE OF BIRTH:  24-Feb-1922  DATE OF PROCEDURE:  01/22/2012  PREOPERATIVE DIAGNOSIS: Intertrochanteric fracture right hip.   POSTOPERATIVE DIAGNOSIS: Intertrochanteric fracture right hip.   PROCEDURE: Open fixation using intramedullary hip screw.   SURGEON: Winn JockJames C. Antwione Picotte, MD    ASSISTANT: None.   ANESTHESIA: General.   ESTIMATED BLOOD LOSS: 100 mL.  REPLACEMENT: 300 mL of Crystalloid.   DRAINS: None.   COMPLICATIONS: None.   IMPLANTS USED: Stryker trochanteric Gamma-3 130 degree nail, 95 mm lag screw, 30 mm distal screw.   BRIEF CLINICAL NOTE AND PATHOLOGY: The patient fell and suffered the above-mentioned fracture. No other abnormalities were noted. She was cleared by Cardiology and brought to the operating room. The fracture reduced very well with manipulation and traction. Hardware had good fixation.   PROCEDURE: Preop antibiotics, adequate general anesthesia, patient transferred to fracture table, all prominences well padded. Routine prepping and draping. The left leg was placed in a well legholder prior to this. The fracture was reduced with traction, internal rotation and adduction. AP and lateral views showed essentially anatomic reduction.   The guidewire was then introduced through the greater trochanter. This was checked on AP and lateral views. The soft tissues were protected while the tapered reamer was used. The nail was then inserted. The guidewire was placed up the neck into the head and checked in AP and lateral views. Associated drilling was performed through a separate incision. The lag screw was placed. AP and lateral views showed good positioning. Compression was then performed and the proximal locking screw was placed and backed off one quarter turn.   The distal locking screw was then placed in a routine fashion again using fluoroscopic guidance and the guide. The screw had excellent purchase. AP, lateral, and  oblique views showed excellent position. The incisions were irrigated and closed with subcuticular Vicryl. The skin was closed with staples. Soft sterile dressing was applied. Sponge and needle counts were reported as correct prior to and after wound closure. The patient was awakened and taken to the PAC-U having tolerated the procedure well.    ____________________________ Winn JockJames C. Gerrit Heckaliff, MD jcc:drc D: 01/23/2012 13:27:00 ET T: 01/23/2012 14:11:50 ET JOB#: [id number]  cc: Winn JockJames C. Gerrit Heckaliff, MD, <Dictator> Winn JockJAMES C Deysi Soldo MD ELECTRONICALLY SIGNED 01/25/2012 14:44

## 2015-04-22 NOTE — Discharge Summary (Signed)
PATIENT NAME:  [name], [name] MR#:  [id number] DATE OF BIRTH:  March 21, 1922  DATE OF ADMISSION:  03/30/2012 DATE OF DISCHARGE:  04/01/2012  For a detailed note, please take a look at the history and physical done by Dr. Winona LegatoVaickute.   DIAGNOSES AT DISCHARGE:  1. Weakness likely secondary to urinary tract infection.  2. Urinary tract infection.  3. Hypertension.  4. Hypothyroidism.  5. Gastroesophageal reflux disease.   DIET: Patient is being discharged on a low sodium diet.   ACTIVITY: As tolerated.   FOLLOW UP: Follow up with [name] Millinganica Glass, FNP in the next 1 to 2 weeks.   DISCHARGE MEDICATIONS:   1. Lisinopril 20 mg daily.  2. Hydrochlorothiazide 25 mg daily.  3. Amlodipine 5 mg daily.  4. Synthroid 112 mcg daily.  5. Protonix 40 mg daily.  6. Ciprofloxacin 500 mg b.i.d. x5 days.   LABORATORY, DIAGNOSTIC AND RADIOLOGICAL DATA: Pertinent studies done during the hospital course are: CT scan of the head done without contrast showing findings sequelae of sinus disease of the right maxillary sinus. No evidence of acute abnormalities. Moderate involutional changes.   Urinalysis positive for urinary tract infection and urine culture is growing 100,000 colonies of gram-negative rods.   HOSPITAL COURSE: This is an 81 year old female with medical problems as mentioned above presented to the hospital on 04/02 due to weakness and confusion.  1. Weakness and confusion. This was likely secondary to the urinary tract infection. Patient's urinalysis was positive for urinary tract infection and the urine culture has grown out greater than 100,000 colonies of gram-negative rods. The sensitivities and identification are still pending. Patient was empirically treated with IV ceftriaxone, given some IV fluids, her clinical symptoms have significantly improved. She was also evaluated by physical therapy and thought she would benefit from home health physical therapy which is being arranged for her.   2. Urinary tract infection. As mentioned she has been treated with IV ceftriaxone here in the hospital, will be discharged on p.o. Cipro as mentioned.  3. Acute renal failure, likely secondary to dehydration. She has been hydrated with IV fluids. Her renal function since then has improved. This was likely secondary to ATN.  4. Hypertension. Patient presently is hemodynamically stable. Her diuretics and ACE inhibitors were initially held due to renal failure and dehydration. Those can be resumed upon discharge. She will continue her maintenance medications including Norvasc, hydrochlorothiazide and lisinopril.  5. Hypothyroidism. She was maintained on her Synthroid, she will resume that.  6. Gastroesophageal reflux disease. Patient was maintained on her Protonix and she will resume that upon discharge too.  7. CODE STATUS: Patient is a FULL CODE.   TIME SPENT: 35 minutes.  ____________________________ Rolly PancakeVivek J. Cherlynn KaiserSainani, MD vjs:cms D: 04/01/2012 14:51:29 ET T: 04/02/2012 09:35:44 ET JOB#: [id number]  cc: Rolly PancakeVivek J. Cherlynn KaiserSainani, MD, <Dictator> Coralyn Pearanica M. Glass, FNP [place] SirenVIVEK J Moani Weipert MD ELECTRONICALLY SIGNED 04/02/2012 14:34

## 2015-04-22 NOTE — Discharge Summary (Signed)
PATIENT NAMKarene [name]:  [name], [name] MR#:  [id number] DATE OF BIRTH:  06-20-1922  DATE OF ADMISSION:  01/21/2012 DATE OF DISCHARGE:  01/26/2012  ADMITTING DIAGNOSIS: Intertrochanteric fracture of the right hip.   DISCHARGE DIAGNOSIS: Intertrochanteric fracture of the right hip.   OPERATION: On 01/22/2012 the patient had open reduction and internal fixation with intramedullary hip screw on the right.   SURGEON: Dr. Gerrit Heckaliff   ANESTHESIA: General.   ESTIMATED BLOOD LOSS: 100 mL.   REPLACEMENT: 300 mL of crystalloid.   DRAINS: None.   COMPLICATIONS: None.   IMPLANTS USED: Stryker trochanteric gamma 3 130-degree nail, 95-mm lag screw, 30-mm distal screw.   HISTORY: The patient is an 81 year old female that presented with complaints of right hip and right shoulder pain. The patient was getting out of her car and fell, landing on her right side. The patient was brought to the Emergency Room where x-rays revealed an intertrochanteric fracture of the right hip and a proximal humerus fracture without displacement. The patient was placed in a sling on the right side and put in Buck's traction and set up for surgery.   PHYSICAL EXAM:  GENERAL: Elderly female who is fairly comfortable in bed, alert and oriented. LUNGS: Clear. CARDIAC: Notable systolic murmur.  RIGHT UPPER EXTREMITY: The patient has tenderness in the proximal humerus region. The patient has very limited range of motion producing pain. The patient has a benign wrist and elbow exam. RIGHT LOWER EXTREMITY: The patient has shortening and external rotation of the right hip, with pain with any attempt at motion. The patient has mild swelling involving the knee. The patient has neurovascular intact.   HOSPITAL COURSE: After initial admission on 01/21/2012 the patient was brought to the orthopedic floor. The patient had surgery the following day. The patient had a hemoglobin of  9.2 the evening after surgery; on the following day it was down to 8.3  and on the 25th down to 7.9. That evening she received 1 unit of transfused blood, with hemoglobin 9.8 on postoperative day two. The patient did well and ambulated up to 10 feet with physical therapy with some difficulty because of her right shoulder fracture. She was sent to rehab on 01/25/2011.   CONDITION AT DISCHARGE: Stable.   DISPOSITION: The patient was sent to rehab.   DISCHARGE INSTRUCTIONS:  1. The patient will follow up at [facility] in two weeks.  2. The patient will work with physical therapy doing right weight bear as tolerated using a platform walker on the right.  3. The patient will work with occupational therapy with activities of daily living.  4. Diet is regular.  5. TED hose thigh-high will be utilized bilaterally.  6. The patient's activity is weight-bear as tolerated.  7. Nursing will use ice pack on the right shoulder and use a sling on the right shoulder.  8. The patient will have dressing change on a p.r.n. basis.   DISCHARGE MEDICATIONS:  1. Tylenol Extra Strength 500 to 1000 mg every six hours as needed for pain.  2. Norvasc 5 mg p.o. daily.  3. Senokot-S 1 tablet p.o. b.i.d.  4. Lovenox 30 mg subcutaneous daily times 24 days and discontinue. 5. Hydrochlorothiazide 25 mg p.o. daily.  6. Synthroid 0.112 mg p.o. q. 6 a.m.  7. Zestril 20 mg p.o. daily.  8. Protonix 40 mg p.o. q. 6 a.m.   ____________________________ J. Dedra Skeensodd [name], [place] jtm:bjt D:  01/26/2012 06:32:47 ET  T: 01/26/2012 06:41:09 ET          JOB#: [id number] J [name] Mungin PA ELECTRONICALLY SIGNED 01/28/2012 7:46

## 2015-04-22 NOTE — H&P (Signed)
PATIENT NAMEMAILEN, NEWBORN MR#:  [id number] DATE OF BIRTH:  16-Aug-1922  DATE OF ADMISSION:  03/30/2012  PRIMARY CARE PHYSICIAN: Mariah Milling, FNP  HISTORY OF PRESENT ILLNESS: Patient is an 81 year old Caucasian female with past medical history significant for history of right hip intertrochanteric fracture in January 2013, status post nail placement operation the same time, history of chronic atrial fibrillation, history of moderate aortic stenosis as well as moderate tricuspid and mitral regurgitation presented to the hospital with complaints of weakness. Apparently patient was brought by EMS from home because she was complaining of left-sided weakness. She tells me that she could not hold on walker and she felt very weak today. She admitted of some falls but she denied any fall today. She had weakness in her ankles but denied any significant weakness on one side more than other side. She has been also complaining of some cough but no significant sputum production. She thinks that she had cold which she brought from her rehab facility where she had rehab after hip fracture. She also admits of having incontinence as well as increased frequency of urination but denies any dysuria, hematuria. On arrival to Emergency Room she was noted to have urinary tract infection, pyuria and hospitalist services were contacted for admission.   PAST MEDICAL HISTORY:  1. History of valvular disease, mild to moderate aortic stenosis, moderate tricuspid as well as moderate mitral regurgitation.  2. Pulmonary hypertension.  3. History of chronic atrial fibrillation. 4. Hypertension. 5. Hyperlipidemia. 6. Ejection fraction 55%. Aortic valve surface approximately 1.1, 1.2 square cm.   7. History of hypothyroidism. 8. Osteoporosis.  9. Chronic kidney disease stage III with baseline creatinine 1.4.  10. Colon cancer diagnosed in May 99 status post resection as well as chemotherapy. 11. History of  diverticulosis. 12. Iron deficiency anemia.  13. Last echocardiogram was performed in January 2013. At that time it showed moderate tricuspid regurgitation, R ventricular pressures elevated at 50 to 60 mmHg, right ventricular systolic function is normal. Ejection fraction equal or more than 55%, mild to moderate mitral regurgitation as well as trace pulmonary valvular regurgitation, right atrium was mildly dilated, mild valvular aortic stenosis was noted but no pericardial effusion   PAST SURGICAL HISTORY:  1. Thyroidectomy. 2. Colon cancer status post reduction and colonoscopy in April 2008 with no colon polyps and nonbleeding hemorrhoids.  3. Urethral cyst reduction. 4. Right hip intertrochanteric fracture repair with nail.   MEDICATIONS: Unknown, however, patient was discharged on 01/26/2012 on: 1. Tylenol Extra Strength 500 to 1000 mg every six hours as needed for pain. 2. Norvasc 5 mg p.o. daily.  3. Senokot-S q tablet twice daily.  4. Lovenox 30 mg subcutaneously daily x24 days then discontinue.  5. Hydrochlorothiazide 25 mg p.o. daily.  6. Synthroid 112 mcg p.o. daily.  7. Zestril 20 mg p.o. daily. 8. Protonix 40 mg p.o. daily. It is unclear if those medications are still being used.   SOCIAL HISTORY: Patient lives in [place] with two of her other sisters. No tobacco, alcohol or drug abuse.   FAMILY HISTORY: Patient's father had myocardial infarction, died of Parkinson's disease at the age of 26. Patient's mother had died of cerebrovascular accident at age of 82. Family history of hypertension.   REVIEW OF SYSTEMS: CONSTITUTIONAL: Positive for feeling weak in her ankles and her legs, not able to walk. Some cough with some sputum production, increased frequency of urination as well as incontinence especially at nighttime. Denies any fevers, chills, fatigue, pains,  weight loss or gain. EYES: In regards to eyes denies any blurry vision, double vision, glaucoma, cataracts. ENT: Denies any  tinnitus, allergies, epistaxis, sinus pain, dentures, difficulty swallowing. RESPIRATORY: Denies any wheezes, asthma, chronic obstructive pulmonary disease. CARDIOVASCULAR: Denies chest pain, orthopnea, edema, dyspnea, palpitations or syncope. GASTROINTESTINAL: Denies nausea, vomiting, diarrhea, constipation. GENITOURINARY: Denies any dysuria, hematuria, renal calculi. ENDOCRINOLOGY: Denies any polydipsia, nocturia, thyroid problems. No cold intolerance or thirst. HEMATOLOGIC: Denies anemia, easy bruising, bleeding, swollen glands. SKIN: Denies acne, rashes, change in moles. MUSCULOSKELETAL: Denies arthritis, cramps, swelling, gout. NEUROLOGIC: No numbness, epilepsy, or tremor. PSYCHIATRIC: Denies anxiety, insomnia, or depression.   PHYSICAL EXAMINATION:  VITAL SIGNS: On arrival to hospital patient's vitals: Temperature 97, pulse 87, respiration rate 20, blood pressure 132/60, saturation 98% on room air.   GENERAL: This is well nourished Caucasian female in no significant distress lying on the stretcher. She is able to move on the stretcher, however, she is somewhat weak and not able to sit up for examination.   HEENT: Her pupils are equal, reactive to light. Extraocular movements are intact. No icterus or conjunctivitis. Has normal hearing. No pharyngeal erythema. Mucosa is dry.   NECK: Neck did not reveal any masses, supple, nontender. Thyroid is not enlarged. No adenopathy. No JVD or carotid bruits bilaterally. Full range of motion.   LUNGS: Clear to auscultation in all fields. Few rhonchi were heard bilaterally but no rales, rhonchi, or wheezing. No labored inspiration, increased effort, dullness to percussion, overt respiratory distress.   CARDIOVASCULAR: S1, S2 appreciated. 4/6 systolic murmur heard precordially without any radiation. PMI not lateralized. Chest is nontender to palpation.   EXTREMITIES: 1+ pedal pulses. No lower extremity edema, calf tenderness, or cyanosis noted.   ABDOMEN:  Soft, nontender. Bowel sounds are present. No hepatosplenomegaly or masses were noted.   RECTAL: Deferred.   MUSCULOSKELETAL: Able to move all extremities. No cyanosis, degenerative joint disease or kyphosis. Gait is not tested.   SKIN: Skin did not reveal any rashes, lesions, erythema, nodularity, induration. It was warm and dry to palpation.   LYMPH: No adenopathy in cervical region.   NEUROLOGICAL: Cranial nerves grossly intact. Sensory grossly intact. No dysarthria, aphasia.   PSYCH: Patient is alert, somewhat disoriented, poorly cooperative. Memory is impaired. No significant confusion, agitation, or depression noted.   LABORATORY, DIAGNOSTIC, AND RADIOLOGICAL DATA: BMP showed BUN and creatinine 27 and 1.60 comparatively to 20 and 1.23 on 01/24/2012. Glucose 131, otherwise BMP is unremarkable. Patient's liver enzymes showed albumin level of 2.6, otherwise unremarkable. Troponin level 0.02. White blood cell count is normal at 8.1, hemoglobin 10.1, platelets 319. Urinalysis: Yellow cloudy urine, negative for glucose, bilirubin or ketones, specific gravity 1.010, pH 6.0, negative for blood, protein, nitrites, 3+ leukocyte esterase were noted, rare red blood cells, too numerous to count white blood cells, 3+ bacteria, 0 to 5 epithelial cells as transitional epithelial cells. EKG showed undetermined rhythm at 80 beats per minute, low voltage QRS, R prime in V1 suggesting right ventricular conduction delay. No acute ST-T changes were noted. No significant change since prior EKG done in Jan 2013 except of poor R wave. CT scan of head without contrast 03/30/2012 showed findings representing sequela of sinus disease involving right maxillary sinus, moderate involutional changes with no evidence of acute abnormalities were noted.   ASSESSMENT AND PLAN:  1. Generalized weakness likely due to urinary tract infection. Admit patient to medical floor. Get physical therapist involved for further  recommendations.  2. Urinary tract infection. Continue patient  on Rocephin. Get urine cultures.  3. Renal insufficiency. Will continue patient on low rate IV fluids.  4. Anemia. Get guaiac. Follow hemoglobin level after rehydration.  5. History of chronic atrial fibrillation. Follow patient on telemetry.  6. History of valvular heart disease. Continue lisinopril.  7. History of hypertension. Continue outpatient medications which is Norvasc as well as lisinopril and hold HCTZ at this time.  8. History of hypothyroidism. Continue Synthroid.  9. History of osteoporosis. Continue calcium as well as vitamin D.   TIME SPENT: 50 minutes.  ____________________________ Katharina Caperima Phyllis Whitefield, MD rv:cms D: 03/30/2012 17:47:18 ET T: 03/31/2012 06:03:02 ET  JOB#: [id number] cc: Katharina Caperima Dearion Huot, MD, <Dictator> Coralyn Pearanica M. Glass, FNP Fabricio Endsley MD ELECTRONICALLY SIGNED 03/31/2012 14:28

## 2015-04-22 NOTE — Consult Note (Signed)
PATIENT NAME:  [name], [name] MR#:  [id number] DATE OF BIRTH:  22-Jul-1922  DATE OF CONSULTATION:  01/22/2012  REFERRING PHYSICIAN:  Winn JockJames C. Gerrit Heckaliff, MD   CONSULTING PHYSICIAN:  [name] Schnapp, MD PRIMARY CARE PHYSICIAN: [name] Pearanica M. Glass, FNP PRIMARY CARDIOLOGIST: [name] HookerBruce Kowalski, MD  PRIMARY ONCOLOGIST: [name] PittsJane K. Ybanez, MD   REASON FOR CONSULTATION: Preoperative evaluation.   HISTORY OF PRESENT ILLNESS: Ms. [name] is a pleasant 81 year old woman with history of valvular disease including moderate aortic stenosis with severe mitral and tricuspid insufficiency, history of moderate pulmonary hypertension, hyperlipidemia, atrial fibrillation, hypertension, hypothyroidism, history of colon cancer-status post resection and chemotherapy, who presents status post fall. She reports that she was coming out of church, lost her balance, and fell on her right side. She presents with resultant right intertrochanteric hip fracture and right humeral neck fracture. The patient denies any chest pain, shortness of breath, presyncope, or syncope. She denies any palpitations prior to the fall.   PAST MEDICAL HISTORY:  1. Chronic atrial fibrillation.  2. Multivalvular heart disease with moderate aortic stenosis, and biatrial enlargement, and severe mitral and tricuspid insufficiency.  3. Moderate pulmonary hypertension.  4. Hyperlipidemia.  5. Hypothyroidism.  6. Hypertension.  7. Osteoporosis.  8. Colon cancer diagnosed in May of 1999-status post resection and chemotherapy.  9. Chronic kidney disease, stage III, baseline creatinine around 1.4.  10. Iron deficiency anemia.  11. Diverticulosis.   PAST SURGICAL HISTORY:  1. Thyroidectomy.  2. Colon cancer-status post resection.  3. Colonoscopy in April of 2008 with no colon polyps and nonbleeding hemorrhoids.  4. Urethral cyst resection.   ALLERGIES: No known drug allergies.   MEDICATIONS:  1. Aspirin 81 mg daily.  2. Amlodipine 5 mg daily.   3. Tylenol 325 mg every 6 hours as needed.  4. Lisinopril 20 mg daily.  5. Hydrochlorothiazide 25 mg daily.  6. Levothyroxine 112 mcg daily.  7. Calcium and vitamin D 600/400 b.i.d.   SOCIAL HISTORY: She lives in [place] with two of her other sisters. She denies any tobacco, alcohol or drug use.   FAMILY HISTORY: Her father had a myocardial infarction and died of Parkinson's at age 79. Mother died of cerebrovascular accident at age 79. There is family history of hypertension.   REVIEW OF SYSTEMS: CONSTITUTIONAL: No fevers, chills, nausea or vomiting. EYES: No glaucoma or cataracts. ENT: No dysphagia or epistaxis. RESPIRATORY: The patient currently denies shortness of breath, but from previous record she had endorsed shortness of breath. No cough or wheezing or hemoptysis. CARDIOVASCULAR: No chest pain, orthopnea, or PND. No changes in her lower extremity swelling. She reports some intermittent leg swelling that resolves on its own. GI: No nausea, vomiting, diarrhea, abdominal pain, hematemesis, or melena. GU: No dysuria or hematuria. ENDOCRINE: No polyuria or polydipsia. HEMATOLOGIC: No easy bleeding. She has easy bruising. SKIN: No ulcers. NEUROLOGICAL: No one-sided weakness or numbness. No history of seizures or strokes. PSYCHIATRIC: Denies any depression or suicidal ideation.   PHYSICAL EXAMINATION:  VITAL SIGNS: Temperature 98.5, pulse 88, respiratory rate 18, blood pressure 166/82, saturations at  97% on room air.   GENERAL: Thin, lying in bed in no apparent distress.   HEENT: Normocephalic, atraumatic. Pupils are equal and symmetric. Moist mucous membrane.   NECK: Soft and supple. No adenopathy or JVP.  CARDIOVASCULAR: Nontachycardic. She has a 3+ systolic murmur, no rubs or gallops.   LUNGS: Clear to auscultation anteriorly. No use of accessory muscles or increased respiratory effort.   ABDOMEN: Soft. Positive bowel sounds.  No mass appreciated.   EXTREMITIES: She has her right lower  extremity in a brace and her right upper extremity in a sling.   MUSCULOSKELETAL: Limited mobility of her right upper and lower extremities.   NEUROLOGICAL: She has symmetrical squeeze of her upper extremity. No dysarthria or aphasia.   PSYCHIATRIC: She is alert and oriented. The patient is cooperative.   PERTINENT LABORATORY, DIAGNOSTIC AND RADIOLOGICAL DATA:  Shoulder, right complete, shows mildly displaced fracture of the surgical neck of the right proximal humerus. Pelvis AP and right hip film shows comminuted right intertrochanteric fracture. Chest x-ray shows no acute findings.  Glucose 131, BUN 32, creatinine 1.3, sodium 143, potassium 4, chloride 103, carbon dioxide 29, calcium 9.2.  WBC 6.1, hemoglobin 11.1, hematocrit 33.5, platelets 170, MCV 98.  WBC 5.3, hemoglobin 11.9, platelets 188, MCV 98.   ASSESSMENT AND PLAN: Ms. [name] is a pleasant 81 year old woman with a history of atrial fibrillation, valvular heart disease, pulmonary hypertension, hypothyroidism, chronic kidney disease, stage III, hypertension, osteoporosis, colon cancer, status post mechanical fall with right intertrochanteric hip fracture and right humeral neck fracture.   1. Preoperative evaluation: The patient is with known cardiac risk factors, per Dr. Philemon Kingdom last note. Echo would be pursued if the patient were to develop further symptoms. Given pending surgery, we will order an echo to assess if there is progression of her multivalvular disease so that she could be correctly risk stratified. She denies any cardiac symptoms. We will also have Cardiology evaluate as well. I discussed with the patient at length.  2. Atrial fibrillation: Rate controlled. Hold aspirin prior to surgery.  3. Hypertension: Restart amlodipine, hydrochlorothiazide, and lisinopril. Continue to follow her creatinine, currently at baseline.  4. Chronic kidney disease, stage III: Stable.  5. Hypothyroidism: Restart Synthroid.   6. Hyperlipidemia: She is not on medication, has chosen dietary control.  7. Iron deficiency anemia: Stable. Monitor her blood count postprocedure.  8. Prophylaxis with Protonix and TEDs and SCDs for now.   TIME SPENT: Approximately 45 minutes spent on patient care.   ____________________________ Reuel Derby, MD ap:cbb D: 01/22/2012 06:02:20 ET T: 01/22/2012 10:38:45 ET JOB#: [id number]  cc: Pearlean Brownie Alberta Cairns, MD, <Dictator> [name] Pear, FNP Reuel Derby MD ELECTRONICALLY SIGNED 02/24/2012 0:32

## 2015-04-22 NOTE — Consult Note (Signed)
General Aspect patient is an 81 year old female with history of chronic atrial fibrillation, mild to moderate aortic stenosis with mitral regurgitation and tricuspid regurgitation as well as hypertension and hyperlipidemia.  She is admitted after suffering a right trochanteric fracture.  This was suffered secondary to a mechanical fall.  Patient denies syncope or presyncope.  She has a history of aortic stenosis by previous echo.  Echocardiogram done during this admission reveals an ejection fraction of 55% with an aortic valve area of 1.1-1.2 cm??.  She has moderate MR and moderate TR today.  She does have evidence of pulmonary hypertension.  She denies chest pain.  Her electrocardiogram reveals atrial fibrillation with controlled ventricular response.  She is not currently anticoagulated due to her preference per note from her primary cardiologist. she denies chest pain, shortness of breath, syncope or presyncope.  She is relatively active despite her age and comorbid condition.   Physical Exam:   GEN WD, WN, NAD    HEENT PERRL    NECK No masses    RESP normal resp effort  clear BS    CARD Irregular rate and rhythm  Murmur    Murmur Systolic    Systolic Murmur Out flow  axilla    ABD denies tenderness  normal BS    LYMPH negative neck    EXTR negative cyanosis/clubbing, negative edema, right lower extremity with  fixation device.    SKIN normal to palpation    NEURO cranial nerves intact, motor/sensory function intact    PSYCH A+O to time, place, person   Review of Systems:   Subjective/Chief Complaint right leg and right shoulder pain    General: No Complaints    Skin: No Complaints    ENT: No Complaints    Eyes: No Complaints    Neck: No Complaints    Respiratory: No Complaints    Cardiovascular: No Complaints    Gastrointestinal: No Complaints    Genitourinary: No Complaints    Vascular: No Complaints    Musculoskeletal: right leg and shoulder pain     Neurologic: No Complaints    Hematologic: No Complaints    Endocrine: No Complaints    Psychiatric: No Complaints    Review of Systems: All other systems were reviewed and found to be negative    Medications/Allergies Reviewed Medications/Allergies reviewed     HTN:    Hypothyroidism:    colon surgery:   Home Medications:  Caltrate tablet 600 mg: 2 tab(s) orally 3 times a day x 30 days , Active  Norvasc tablet 5 mg: 1 tab(s) orally once a day x 30 days , Active  HCTZ : Active  Aspir 81 enteric coated tablet 81 mg: 1 tab(s) orally once a day x 30 days , Active  lisinopril 20 mg oral tablet: 1 tab(s) orally once a day, Active  EKG:   Interpretation atrial fibrillation with controlled ventricular response    No Known Allergies:     Impression patient is an 81 year old female with history of chronic atrial  fibrillation and  moderate aortic stenosis with MR and TR.  She is not chronically anticoagulated due to her wishes.  She is hemodynamically stable with good rate control for atrial fibrillation.  She is no evidence of congestive heart failure.  Her LV function is normal..  He appears to be well compensated from a cardiac standpoint for surgery.  Would classify the patient has moderate risk do to her atrial fibrillation and mild to moderate aortic stenosis.  Would proceed with surgery with routine cardiopulmonary monitoring.  Would continue her on current medications during the perioperative period.  Appear to require any further cardiac evaluation prior to semiurgent surgery.    Plan 1 continue with current medications 2.  Proceed with surgery routine cardiopulmonary monitoring 3.  Will follow as needed postop.   Electronic Signatures: Dalia HeadingFath, Arrielle Mcginn A (MD)  (Signed 24-Jan-13 11:36)  Authored: General Aspect/Present Illness, History and Physical Exam, Review of System, Past Medical History, Home Medications, EKG , Allergies, Impression/Plan   Last Updated: 24-Jan-13  11:36 by Dalia HeadingFath, Angelette Ganus A (MD)
